# Patient Record
Sex: Male | Born: 2005 | Race: Black or African American | Hispanic: No | Marital: Single | Smoking: Never smoker
Health system: Southern US, Community
[De-identification: ages and names within clinical notes are randomized; demographics above are authoritative.]

## PROBLEM LIST (undated history)

## (undated) DIAGNOSIS — R4689 Other symptoms and signs involving appearance and behavior: Secondary | ICD-10-CM

## (undated) DIAGNOSIS — J45909 Unspecified asthma, uncomplicated: Secondary | ICD-10-CM

## (undated) DIAGNOSIS — J309 Allergic rhinitis, unspecified: Secondary | ICD-10-CM

## (undated) DIAGNOSIS — K3580 Unspecified acute appendicitis: Secondary | ICD-10-CM

## (undated) DIAGNOSIS — F329 Major depressive disorder, single episode, unspecified: Secondary | ICD-10-CM

## (undated) DIAGNOSIS — F909 Attention-deficit hyperactivity disorder, unspecified type: Secondary | ICD-10-CM

## (undated) HISTORY — DX: Other symptoms and signs involving appearance and behavior: R46.89

## (undated) HISTORY — DX: Major depressive disorder, single episode, unspecified: F32.9

## (undated) HISTORY — DX: Unspecified acute appendicitis: K35.80

## (undated) HISTORY — DX: Attention-deficit hyperactivity disorder, unspecified type: F90.9

## (undated) HISTORY — DX: Allergic rhinitis, unspecified: J30.9

## (undated) HISTORY — PX: APPENDECTOMY: SHX54

---
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
## 2023-01-29 IMAGING — DX DG FOOT COMPLETE 3+V*R*
3 series · 3 of 3 positions shown · non-contrast
Comparison: None.

CLINICAL DATA: Right foot pain between 2nd and 3rd toes

EXAM:
RIGHT FOOT COMPLETE - 3+ VIEW

[foot ap]
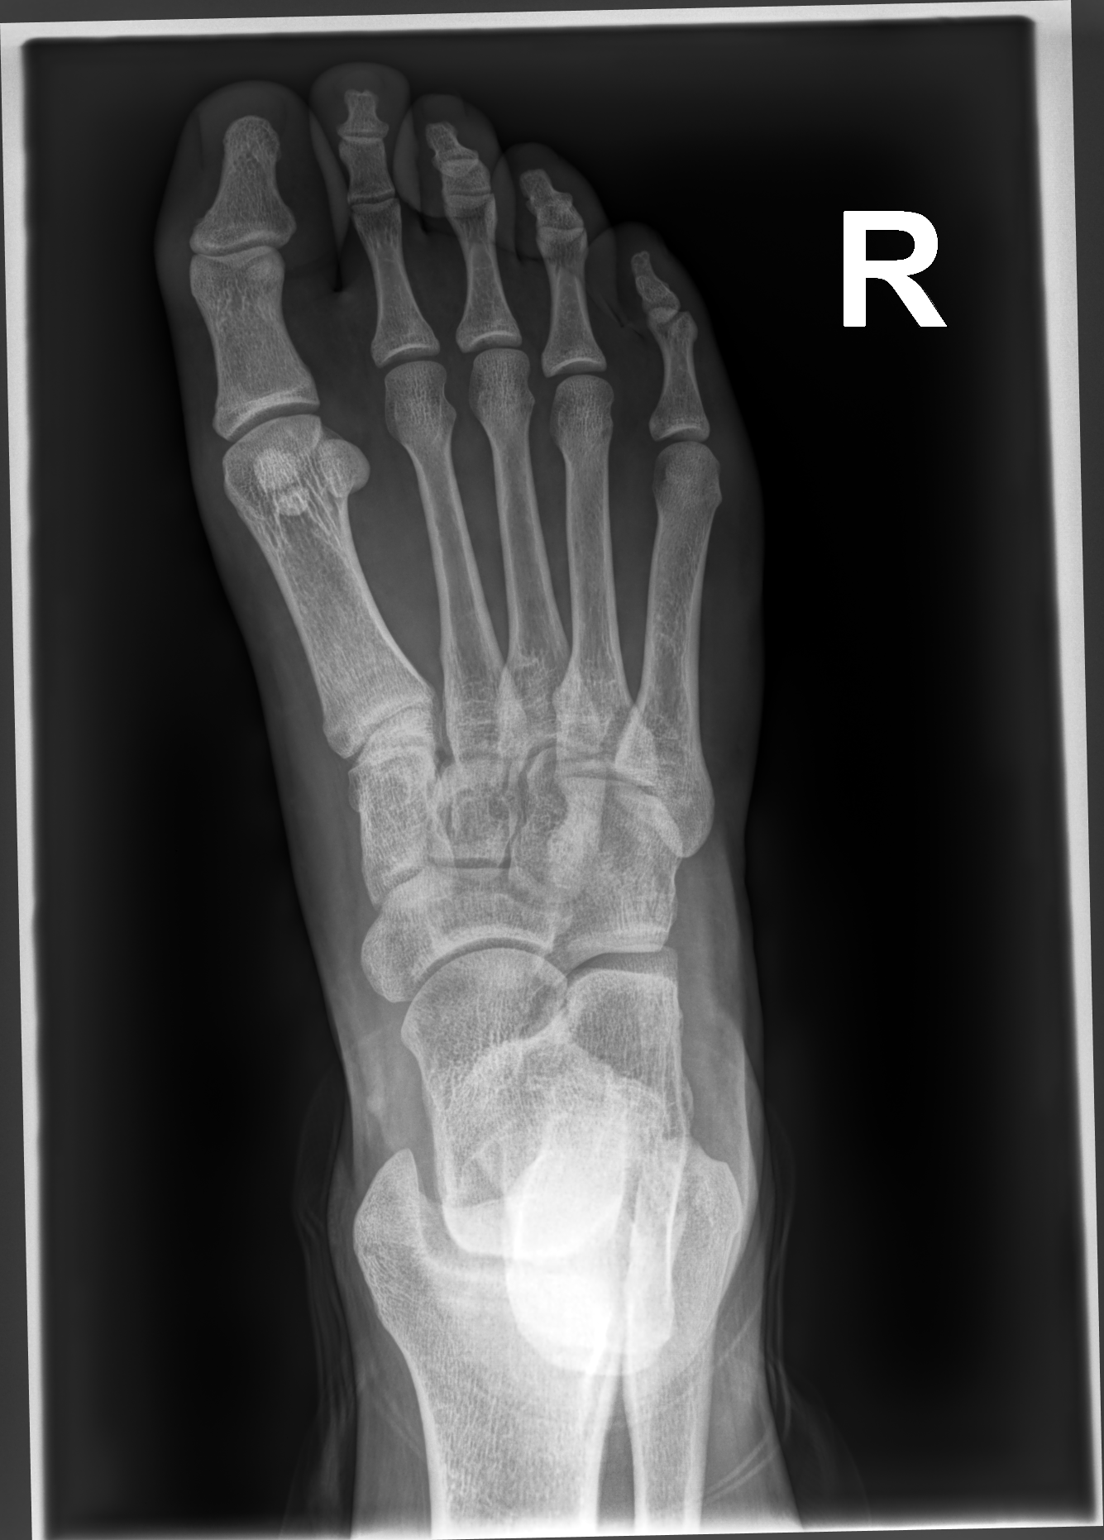

[foot mlo]
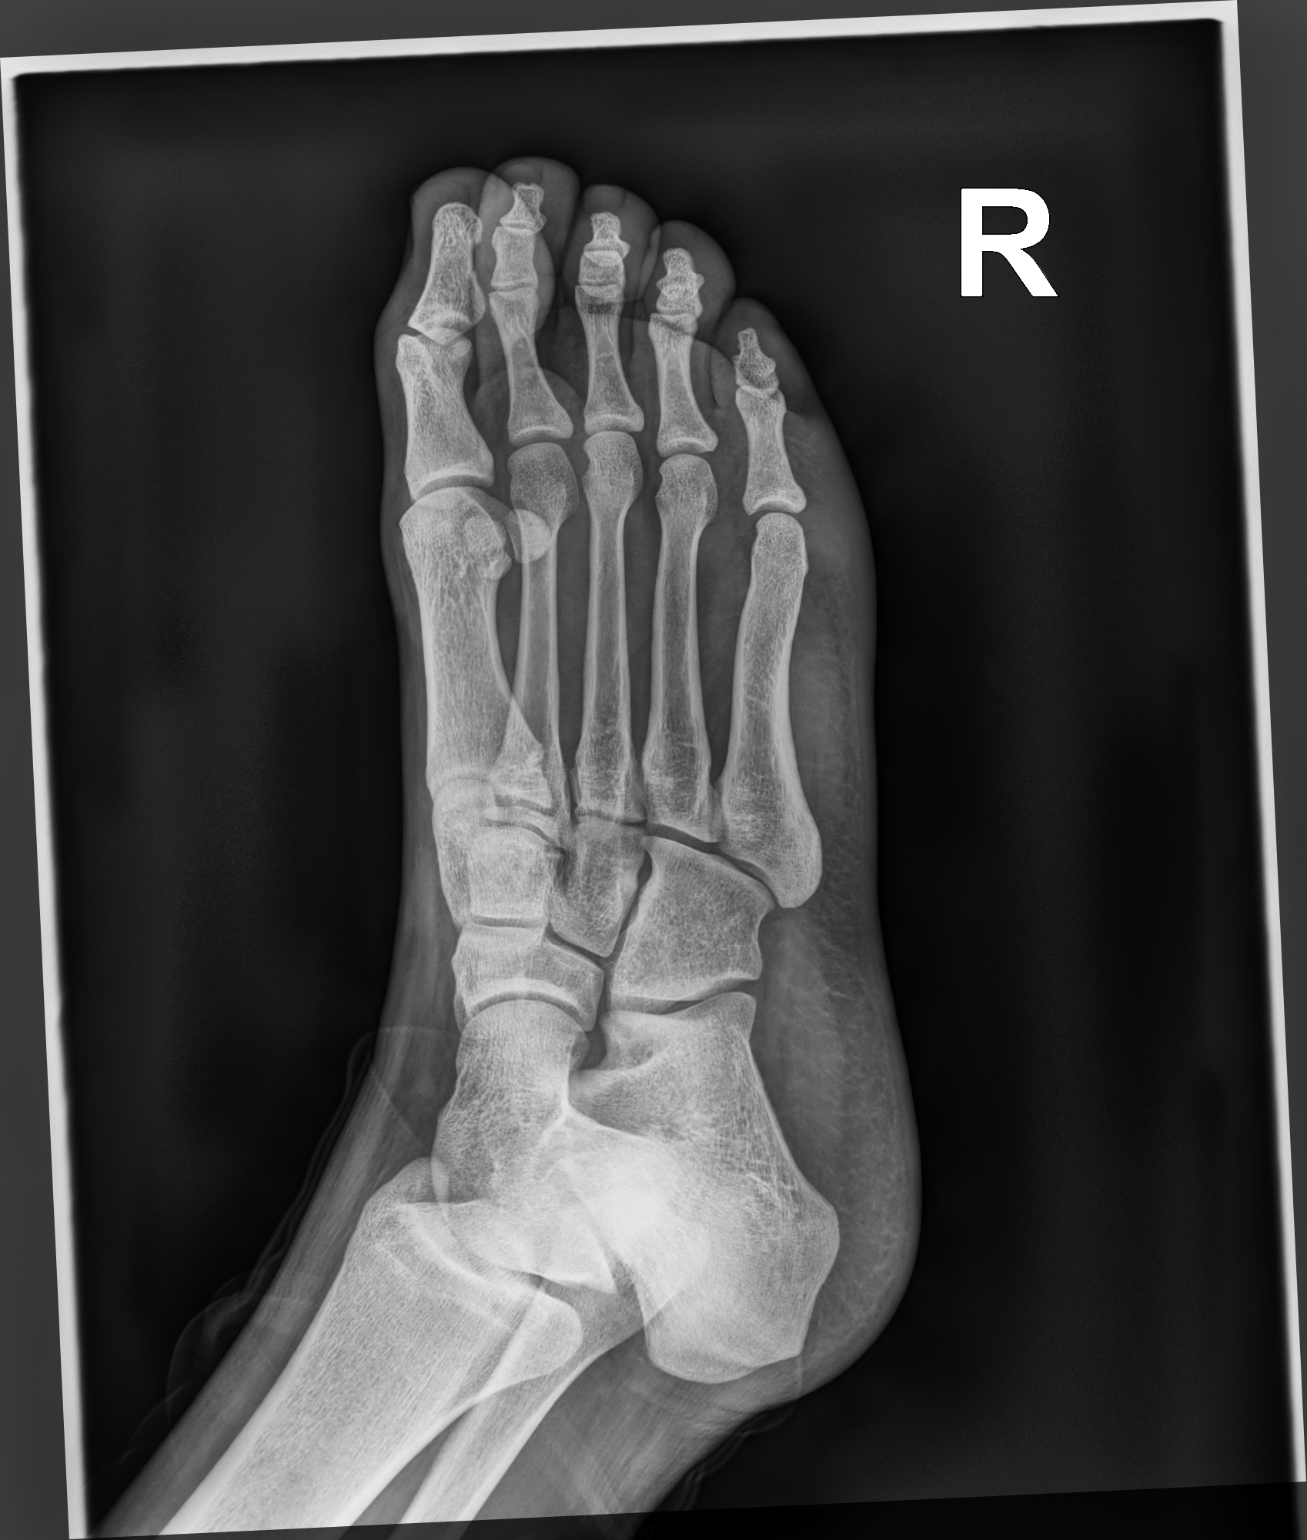

[foot lat]
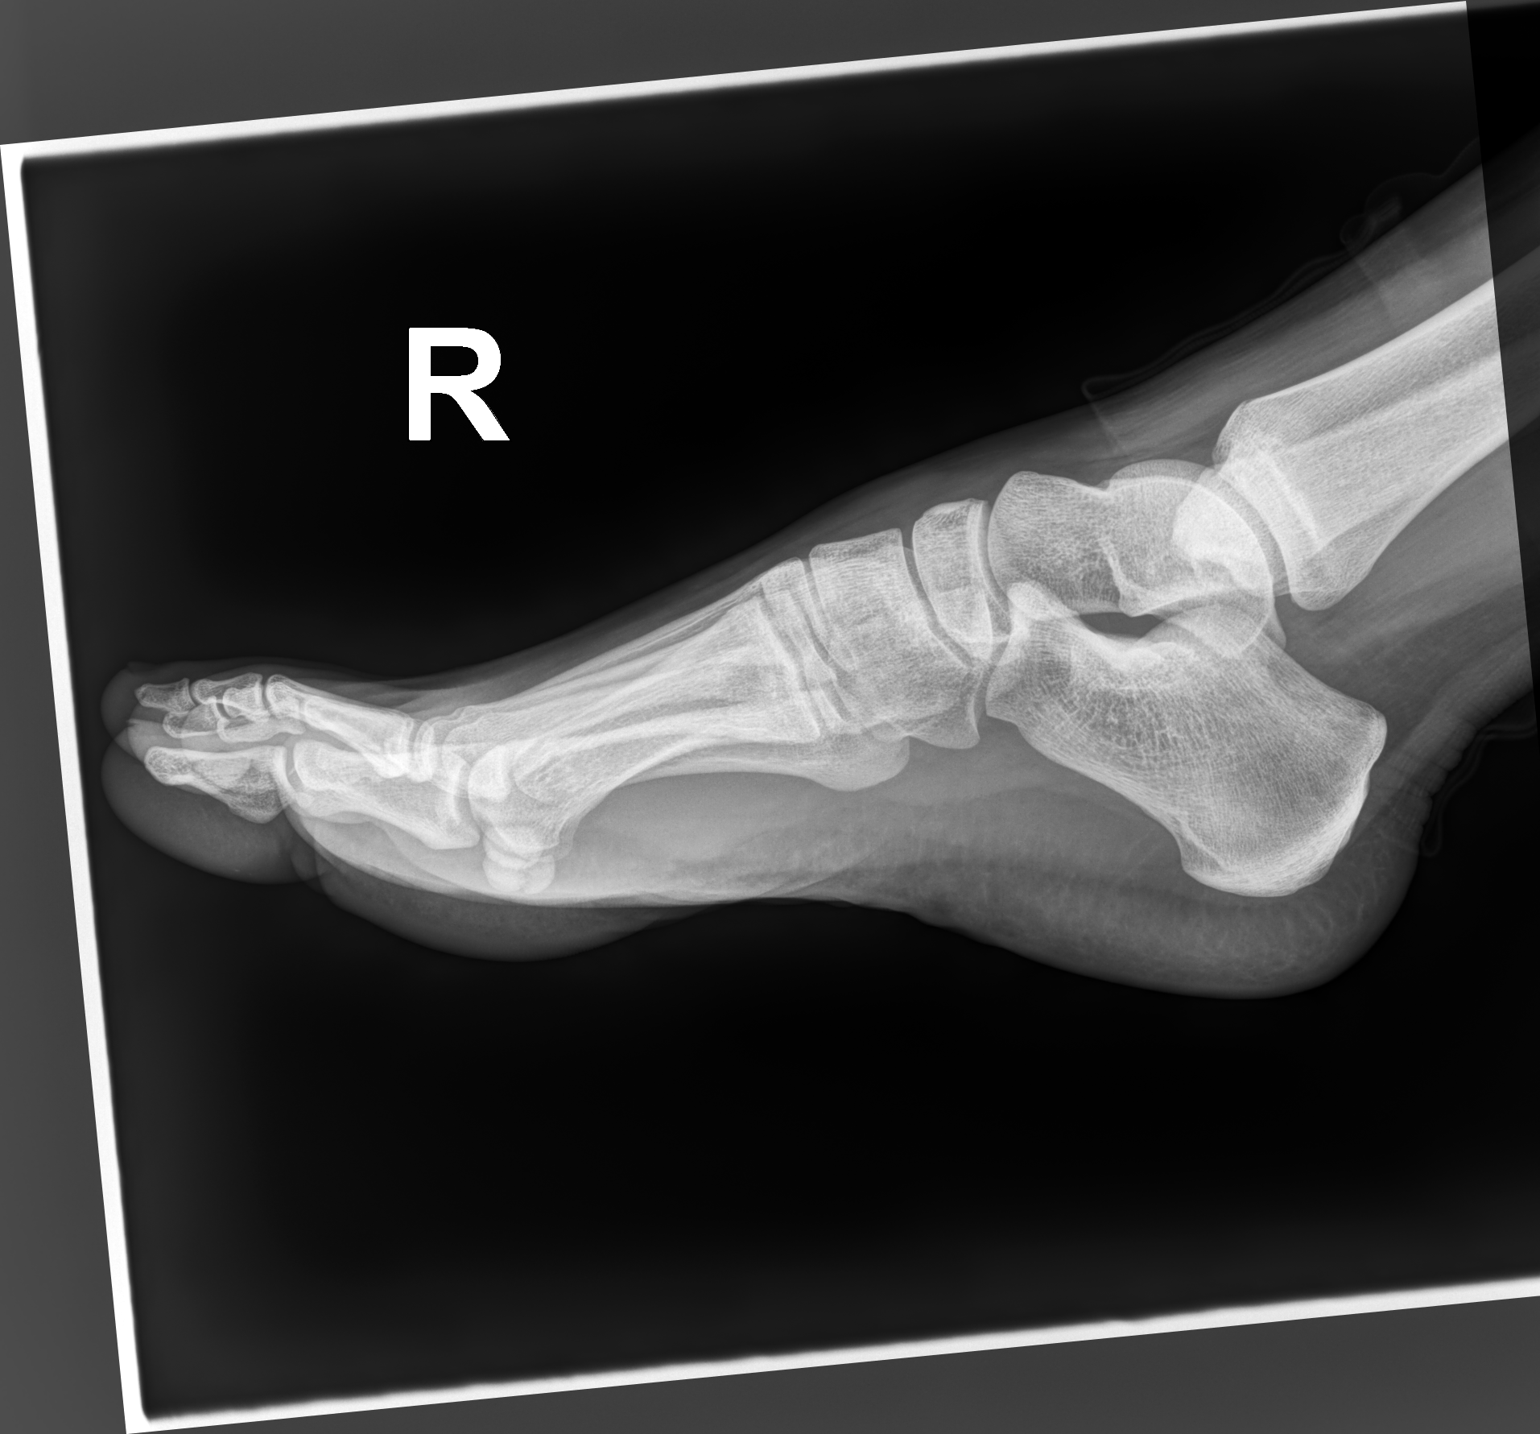

[3 of 3 positions shown; findings below may reference images not displayed]

FINDINGS: There is no evidence of fracture or dislocation. There is no
evidence of arthropathy or other focal bone abnormality. Soft
tissues are unremarkable.
IMPRESSION: Negative.

## 2023-02-03 ENCOUNTER — Ambulatory Visit: Payer: Medicaid Other | Admitting: Family Medicine

## 2023-02-08 ENCOUNTER — Encounter: Payer: Self-pay | Admitting: Family Medicine

## 2023-02-10 ENCOUNTER — Ambulatory Visit: Payer: Medicaid Other | Admitting: Family Medicine

## 2023-02-25 ENCOUNTER — Encounter: Payer: Self-pay | Admitting: Family Medicine

## 2023-02-25 ENCOUNTER — Ambulatory Visit (INDEPENDENT_AMBULATORY_CARE_PROVIDER_SITE_OTHER): Payer: Medicaid Other | Admitting: Family Medicine

## 2023-02-25 VITALS — BP 117/70 | HR 67 | Ht 66.0 in | Wt 163.0 lb

## 2023-02-25 DIAGNOSIS — Z114 Encounter for screening for human immunodeficiency virus [HIV]: Secondary | ICD-10-CM

## 2023-02-25 DIAGNOSIS — R7301 Impaired fasting glucose: Secondary | ICD-10-CM

## 2023-02-25 DIAGNOSIS — E7849 Other hyperlipidemia: Secondary | ICD-10-CM

## 2023-02-25 DIAGNOSIS — Z23 Encounter for immunization: Secondary | ICD-10-CM | POA: Diagnosis not present

## 2023-02-25 DIAGNOSIS — E038 Other specified hypothyroidism: Secondary | ICD-10-CM

## 2023-02-25 DIAGNOSIS — E559 Vitamin D deficiency, unspecified: Secondary | ICD-10-CM

## 2023-02-25 DIAGNOSIS — K581 Irritable bowel syndrome with constipation: Secondary | ICD-10-CM | POA: Diagnosis not present

## 2023-02-25 DIAGNOSIS — J3089 Other allergic rhinitis: Secondary | ICD-10-CM

## 2023-02-25 DIAGNOSIS — R112 Nausea with vomiting, unspecified: Secondary | ICD-10-CM | POA: Diagnosis not present

## 2023-02-25 DIAGNOSIS — J4520 Mild intermittent asthma, uncomplicated: Secondary | ICD-10-CM

## 2023-02-25 DIAGNOSIS — G43009 Migraine without aura, not intractable, without status migrainosus: Secondary | ICD-10-CM | POA: Diagnosis not present

## 2023-02-25 DIAGNOSIS — K219 Gastro-esophageal reflux disease without esophagitis: Secondary | ICD-10-CM | POA: Diagnosis not present

## 2023-02-25 MED ORDER — DICYCLOMINE HCL 10 MG PO CAPS: 10.0000 mg | ORAL_CAPSULE | Freq: Two times a day (BID) | ORAL | 3 refills | Status: AC | PRN

## 2023-02-25 MED ORDER — VITAMIN D (ERGOCALCIFEROL) 1.25 MG (50000 UNIT) PO CAPS: 50000.0000 [IU] | ORAL_CAPSULE | ORAL | 3 refills | Status: AC

## 2023-02-25 MED ORDER — SUMATRIPTAN SUCCINATE 25 MG PO TABS: 25.0000 mg | ORAL_TABLET | ORAL | 0 refills | Status: AC | PRN

## 2023-02-25 MED ORDER — OMEPRAZOLE 40 MG PO CPDR: 40.0000 mg | DELAYED_RELEASE_CAPSULE | Freq: Two times a day (BID) | ORAL | 1 refills | Status: AC

## 2023-02-25 MED ORDER — FLUTICASONE PROPIONATE 50 MCG/ACT NA SUSP: 2.0000 | Freq: Every day | NASAL | 1 refills | Status: AC | PRN

## 2023-02-25 MED ORDER — ONDANSETRON 4 MG PO TBDP: 4.0000 mg | ORAL_TABLET | Freq: Three times a day (TID) | ORAL | 1 refills | Status: AC | PRN

## 2023-02-25 MED ORDER — RIBOFLAVIN 400 MG PO CAPS: 1.0000 | ORAL_CAPSULE | Freq: Every day | ORAL | 2 refills | Status: AC

## 2023-02-25 MED ORDER — VENTOLIN HFA 108 (90 BASE) MCG/ACT IN AERS: 2.0000 | INHALATION_SPRAY | RESPIRATORY_TRACT | 1 refills | Status: AC | PRN

## 2023-02-25 NOTE — Assessment & Plan Note (Signed)
Patient educated on CDC recommendation for the vaccine. Verbal consent was obtained from the patient, vaccine administered by nurse, no sign of adverse reactions noted at this time. Patient education on arm soreness and use of tylenol or ibuprofen for this patient  was discussed. Patient educated on the signs and symptoms of adverse effect and advise to contact the office if they occur.  

## 2023-02-25 NOTE — Progress Notes (Signed)
Established Patient Office Visit  Subjective:  Patient ID: Andrew Cabrera, male    DOB: October 15, 2005  Age: 17 y.o. MRN: [id number]  CC:  Chief Complaint  Patient presents with   Care Management    Follow up     HPI Andrew Cabrera is a 17 y.o. male with past medical history of migraine headaches,GERD presents for f/u of  chronic medical conditions.  The patient is requesting refills today of his medications.  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  Gastric biopsy with mild nonspecific reactive gastropathy, negative for H. Pylori.   LAPAROSCOPIC APPENDECTOMY N/A 11/28/2018   Procedure: APPENDECTOMY LAPAROSCOPIC;  Surgeon: Franky Macho, MD;  Location: [facility];  Service: General;  Laterality: N/A;    Family History  Problem Relation Age of Onset   Migraines Mother     Social History   Socioeconomic History   Marital status: Single    Spouse name: Not on file   Number of children: Not on file   Years of education: Not on file   Highest education level: Not on file  Occupational History   Not on file  Tobacco Use   Smoking status: Never    Passive exposure: Yes   Smokeless tobacco: Never  Vaping Use   Vaping status: Never Used  Substance and Sexual Activity   Alcohol use: Not Currently    Comment: Previously drank socially and reports heavy drinking during the time.  No alcohol since early Jan. 2024.   Drug use: Not  Currently    Comment: Last used 1 month ago (documented 07/02/22)   Sexual activity: Not Currently  Other Topics Concern   Not on file  Social History Narrative   Lives with mother       12th grade (2022-2023)    Social Determinants of Health   Financial Resource Strain: Not on file  Food Insecurity: Not on file  Transportation Needs: Not on file  Physical Activity: Not on file  Stress: Not on file  Social Connections: Not on file  Intimate Partner Violence: Not on file    Outpatient Medications Prior to Visit  Medication Sig Dispense Refill   ipratropium (ATROVENT) 0.03 % nasal spray Place 2 sprays into both nostrils daily as needed for rhinitis.     polyethylene glycol powder (GLYCOLAX/MIRALAX) 17 GM/SCOOP powder Take 17 g by mouth daily. 765 g 7   dicyclomine (BENTYL) 10 MG capsule Take 1 capsule (10 mg total) by mouth every 12 (twelve) hours as needed for spasms (abdominal pain). (Patient taking differently: Take 10 mg by mouth daily.) 90 capsule 3   fluticasone (FLONASE) 50 MCG/ACT nasal spray Place 2 sprays into both nostrils daily as needed for rhinitis.     omeprazole (PRILOSEC) 40 MG capsule Take 40 mg by mouth 2 (two) times daily.     ondansetron (ZOFRAN-ODT) 4 MG disintegrating tablet Take 4 mg by mouth every 8 (eight) hours as  needed for nausea or vomiting.     Riboflavin 400 MG CAPS Take 1 capsule (400 mg total) by mouth daily. 30 capsule 2   SUMAtriptan (IMITREX) 25 MG tablet Take 1 tablet (25 mg total) by mouth every 2 (two) hours as needed for migraine. May repeat in 2 hours if headache persists or recurs. (Patient taking differently: Take 25 mg by mouth every other day. May repeat in 2 hours if headache persists or recurs.) 10 tablet 0   VENTOLIN HFA 108 (90 Base) MCG/ACT inhaler Inhale 2 puffs into the lungs every 4 (four) hours as needed for wheezing or shortness of breath. 18 g 1   Vitamin D, Ergocalciferol, (DRISDOL) 1.25 MG (50000 UNIT) CAPS capsule Take 1 capsule  (50,000 Units total) by mouth every 7 (seven) days. 20 capsule 3   No facility-administered medications prior to visit.    No Known Allergies  ROS Review of Systems  Constitutional:  Negative for fatigue and fever.  Eyes:  Negative for visual disturbance.  Respiratory:  Negative for chest tightness and shortness of breath.   Cardiovascular:  Negative for chest pain and palpitations.  Neurological:  Negative for dizziness and headaches.      Objective:    Physical Exam HENT:     Head: Normocephalic.     Right Ear: External ear normal.     Left Ear: External ear normal.     Nose: No congestion or rhinorrhea.     Mouth/Throat:     Mouth: Mucous membranes are moist.  Cardiovascular:     Rate and Rhythm: Regular rhythm.     Heart sounds: No murmur heard. Pulmonary:     Effort: No respiratory distress.     Breath sounds: Normal breath sounds.  Neurological:     Mental Status: He is alert.     BP 117/70   Pulse 67   Ht 5\' 6"  (1.676 m)   Wt 163 lb 0.6 oz (74 kg)   SpO2 98%   BMI 26.32 kg/m  Wt Readings from Last 3 Encounters:  02/25/23 163 lb 0.6 oz (74 kg) (62%, Z= 0.31)*  12/04/22 158 lb (71.7 kg) (56%, Z= 0.14)*  11/11/22 167 lb 9.6 oz (76 kg) (69%, Z= 0.50)*   * Growth percentiles are based on CDC (Boys, 2-20 Years) data.    Lab Results  Component Value Date   TSH 1.140 08/04/2022   Lab Results  Component Value Date   WBC 5.5 08/04/2022   HGB 15.1 08/04/2022   HCT 45.8 08/04/2022   MCV 91 08/04/2022   PLT 304 08/04/2022   Lab Results  Component Value Date   NA 139 08/04/2022   K 5.0 08/04/2022   CO2 22 08/04/2022   GLUCOSE 79 08/04/2022   BUN 12 08/04/2022   CREATININE 1.22 08/04/2022   BILITOT 0.3 08/04/2022   ALKPHOS 79 08/04/2022   AST 31 08/04/2022   ALT 28 08/04/2022   PROT 7.5 08/04/2022   ALBUMIN 4.5 08/04/2022   CALCIUM 10.3 (H) 08/04/2022   ANIONGAP 16 (H) 04/13/2022   EGFR 88 08/04/2022   Lab Results  Component Value Date    CHOL 170 (H) 08/04/2022   Lab Results  Component Value Date   HDL 68 08/04/2022   Lab Results  Component Value Date   LDLCALC 87 08/04/2022   Lab Results  Component Value Date   TRIG 83 08/04/2022   Lab Results  Component Value Date   CHOLHDL 2.5 08/04/2022   Lab Results  Component Value Date   HGBA1C 5.5 08/04/2022      Assessment & Plan:  Migraine without aura and without status migrainosus, not intractable Assessment & Plan: Stable Refills sent  Orders: -     Riboflavin; Take 1 capsule (400 mg total) by mouth daily.  Dispense: 30 capsule; Refill: 2 -     SUMAtriptan Succinate; Take 1 tablet (25 mg total) by mouth every 2 (two) hours as needed for migraine. May repeat in 2 hours if headache persists or recurs.  Dispense: 10 tablet; Refill: 0  Encounter for immunization Assessment & Plan: Patient educated on CDC recommendation for the vaccine. Verbal consent was obtained from the patient, vaccine administered by nurse, no sign of adverse reactions noted at this time. Patient education on arm soreness and use of tylenol or ibuprofen for this patient  was discussed. Patient educated on the signs and symptoms of adverse effect and advise to contact the office if they occur.   Orders: -     Flu vaccine trivalent PF, 6mos and older(Flulaval,Afluria,Fluarix,Fluzone)  Gastroesophageal reflux disease without esophagitis Assessment & Plan: Refills sent  Orders: -     Omeprazole; Take 1 capsule (40 mg total) by mouth 2 (two) times daily.  Dispense: 60 capsule; Refill: 1  Irritable bowel syndrome with constipation -     Dicyclomine HCl; Take 1 capsule (10 mg total) by mouth every 12 (twelve) hours as needed for spasms (abdominal pain).  Dispense: 90 capsule; Refill: 3  Nausea and vomiting, unspecified vomiting type Assessment & Plan: Refill sent  Orders: -     Ondansetron; Take 1 tablet (4 mg total) by mouth every 8 (eight) hours as needed for nausea or vomiting.   Dispense: 20 tablet; Refill: 1  Mild intermittent asthma without complication -     Ventolin HFA; Inhale 2 puffs into the lungs every 4 (four) hours as needed for wheezing or shortness of breath.  Dispense: 18 g; Refill: 1  Vitamin D deficiency -     Vitamin D (Ergocalciferol); Take 1 capsule (50,000 Units total) by mouth every 7 (seven) days.  Dispense: 20 capsule; Refill: 3 -     VITAMIN D 25 Hydroxy (Vit-D Deficiency, Fractures)  IFG (impaired fasting glucose) -     Hemoglobin A1c  TSH (thyroid-stimulating hormone deficiency) -     TSH + free T4  Other hyperlipidemia -     Lipid panel -     CMP14+EGFR -     CBC with Differential/Platelet  Non-seasonal allergic rhinitis, unspecified trigger -     Fluticasone Propionate; Place 2 sprays into both nostrils daily as needed for rhinitis.  Dispense: 11.1 mL; Refill: 1  Encounter for screening for HIV -     HIV Antibody (routine testing w rflx)  Note: This chart has been completed using Engineer, civil (consulting) software, and while attempts have been made to ensure accuracy, certain words and phrases may not be transcribed as intended.    Follow-up: Return in about 4 months (around 06/25/2023).   Gilmore Laroche, FNP

## 2023-02-25 NOTE — Assessment & Plan Note (Signed)
Stable. Refills sent.

## 2023-02-25 NOTE — Assessment & Plan Note (Signed)
Refill sent.

## 2023-02-25 NOTE — Assessment & Plan Note (Signed)
Refills sent

## 2023-02-25 NOTE — Patient Instructions (Addendum)

## 2023-05-14 ENCOUNTER — Ambulatory Visit (INDEPENDENT_AMBULATORY_CARE_PROVIDER_SITE_OTHER): Payer: BC Managed Care – PPO | Admitting: Gastroenterology

## 2023-05-14 ENCOUNTER — Encounter (INDEPENDENT_AMBULATORY_CARE_PROVIDER_SITE_OTHER): Payer: Self-pay

## 2023-06-29 ENCOUNTER — Ambulatory Visit: Payer: Medicaid Other | Admitting: Family Medicine

## 2023-09-10 ENCOUNTER — Ambulatory Visit: Admitting: Family Medicine

## 2023-11-13 ENCOUNTER — Ambulatory Visit: Admitting: Family Medicine

## 2023-12-25 ENCOUNTER — Encounter: Payer: Self-pay | Admitting: *Deleted

## 2024-01-20 ENCOUNTER — Encounter (INDEPENDENT_AMBULATORY_CARE_PROVIDER_SITE_OTHER): Payer: Self-pay | Admitting: Gastroenterology
